# Patient Record
Sex: Female | Born: 1970 | Race: Black or African American | Hispanic: No | Marital: Single | Smoking: Current every day smoker
Health system: Southern US, Community
[De-identification: ages and names within clinical notes are randomized; demographics above are authoritative.]

## PROBLEM LIST (undated history)

## (undated) DIAGNOSIS — M109 Gout, unspecified: Secondary | ICD-10-CM

---

## 1991-01-30 HISTORY — PX: TUBAL LIGATION: SHX77

## 2015-10-12 ENCOUNTER — Emergency Department
Admission: EM | Admit: 2015-10-12 | Discharge: 2015-10-12 | Disposition: A | Discharge status: Home / Self Care | Payer: Self-pay | Attending: Student in an Organized Health Care Education/Training Program | Admitting: Student in an Organized Health Care Education/Training Program

## 2015-10-12 DIAGNOSIS — M10071 Idiopathic gout, right ankle and foot: Secondary | ICD-10-CM | POA: Insufficient documentation

## 2015-10-12 DIAGNOSIS — F17200 Nicotine dependence, unspecified, uncomplicated: Secondary | ICD-10-CM | POA: Insufficient documentation

## 2015-10-12 DIAGNOSIS — M109 Gout, unspecified: Secondary | ICD-10-CM

## 2015-10-12 MED ORDER — INDOMETHACIN 50 MG PO CAPS
50.0000 mg | ORAL_CAPSULE | Freq: Once | ORAL | Status: AC
  Administered 2015-10-12: 50 mg via ORAL
  Filled 2015-10-12: qty 1

## 2015-10-12 MED ORDER — INDOMETHACIN 50 MG PO CAPS: 50.0000 mg | ORAL_CAPSULE | Freq: Two times a day (BID) | ORAL | 0 refills | Status: DC

## 2015-10-12 NOTE — ED Notes (Signed)
Pt has right 2nd,3rd, 4th,5th toe pain.  Pt states sx for 3 days.  Toes are red and tender to touch.  No known injury to foot.  No hx gout. Pt states she is on her feet a lot at work.

## 2015-10-12 NOTE — ED Provider Notes (Signed)
[facility] Emergency Department Provider Note   ____________________________________________   First MD Initiated Contact with Patient 10/12/15 1603     (approximate)  I have reviewed the triage vital signs and the nursing notes.   HISTORY  Chief Complaint Foot Pain   HPI Maria Knox is a 45 y.o. female who presents with right foot pain x3 days. Patient began having pain Monday after work, pain has moved across toes but has not worsened in severity. She describes the pain as a throbbing sensation that is intermittent and exacerbated by putting weight on the RLE. She has also noticed some redness and swelling, and burning sensation in toes. No history of diabetes, blood clots, or gout. She denies any recent trauma to the right foot. She has been using bengay-like ointment and ibuprofen. The ibuprofen gives her moderate pain relief.    No past medical history on file.  There are no active problems to display for this patient.   Past Surgical History:  Procedure Laterality Date  . TUBAL LIGATION  1993    Prior to Admission medications   Medication Sig Start Date End Date Taking? Authorizing Provider  indomethacin (INDOCIN) 50 MG capsule Take 1 capsule (50 mg total) by mouth 2 (two) times daily with a meal. 10/12/15   Chinita Pesterari B Bralyn Espino, FNP    Allergies Codeine  No family history on file.  Social History Social History  Substance Use Topics  . Smoking status: Current Every Day Smoker  . Smokeless tobacco: Not on file  . Alcohol use Yes    Review of Systems Constitutional: No fever/chills Cardiovascular: Denies chest pain. Respiratory: Denies shortness of breath. Gastrointestinal: No abdominal pain.  No nausea, no vomiting.  No diarrhea.  No constipation. Genitourinary: Negative for dysuria. Musculoskeletal: Negative for back pain. Positive for right foot pain across her toes.  Skin: Negative for rash. Neurological: Negative for  headaches, focal weakness or numbness. ____________________________________________   PHYSICAL EXAM:  VITAL SIGNS: ED Triage Vitals  Enc Vitals Group     BP 10/12/15 1545 116/64     Pulse Rate 10/12/15 1543 84     Resp 10/12/15 1543 18     Temp 10/12/15 1543 98.3 F (36.8 C)     Temp Source 10/12/15 1543 Oral     SpO2 10/12/15 1543 100 %     Weight 10/12/15 1544 130 lb (59 kg)     Height 10/12/15 1544 5\' 2"  (1.575 m)     Head Circumference --      Peak Flow --      Pain Score 10/12/15 1544 3     Pain Loc --      Pain Edu? --      Excl. in GC? --     Constitutional: Alert and oriented. Well appearing and in no acute distress. Eyes: Conjunctivae are normal.  Head: Atraumatic. Neck: No stridor.   Cardiovascular: DP and PT pulses 2+ bilaterally. Toes acyanotic and warm with brisk capillary refill bilaterally.  Respiratory: Normal respiratory effort.  No retractions.  Musculoskeletal: Right foot with mild swelling and erythema across metatarsal heads, no warmth felt. Full ROM of right ankle without pain or difficulty.  Neurologic:  Normal speech and language. No gross focal neurologic deficits are appreciated. No gait instability. Skin:  Skin is warm, dry and intact. No rash noted. Psychiatric: Mood and affect are normal. Speech and behavior are normal.  ____________________________________________   LABS (all labs ordered are listed, but only abnormal  results are displayed)  Labs Reviewed - No data to display ____________________________________________  EKG  ____________________________________________  RADIOLOGY  ____________________________________________   PROCEDURES  Procedure(s) performed: None  Procedures  Critical Care performed: No  ____________________________________________   INITIAL IMPRESSION / ASSESSMENT AND PLAN / ED COURSE  Pertinent labs & imaging results that were available during my care of the patient were reviewed by me and  considered in my medical decision making (see chart for details).  Patient given 50 mg indomethacin in emergency department for treatment of right foot pain from gout. Given prescription for indomethacin. Referred for follow up with podiatry. No other emergency medicine complaints at this time.   Clinical Course     ____________________________________________   FINAL CLINICAL IMPRESSION(S) / ED DIAGNOSES  Final diagnoses:  Acute gout of right foot, unspecified cause      NEW MEDICATIONS STARTED DURING THIS VISIT:  There are no discharge medications for this patient.    Note:  This document was prepared using Dragon voice recognition software and may include unintentional dictation errors.   Chinita Pester, FNP 10/12/15 2003    Willy Eddy, MD 10/12/15 [phone]

## 2015-10-12 NOTE — ED Triage Notes (Signed)
Pt arrives with c/o right sided foot and toe pain  Pt reports on Monday after she got off work her toes were hurting and she has tried home remedies with no relief

## 2015-11-02 MED ORDER — ONDANSETRON HCL 4 MG/2ML IJ SOLN
INTRAMUSCULAR | Status: AC
  Filled 2015-11-02: qty 2

## 2015-11-02 MED ORDER — HYDROMORPHONE HCL 1 MG/ML IJ SOLN
INTRAMUSCULAR | Status: AC
  Filled 2015-11-02: qty 1

## 2016-08-11 ENCOUNTER — Emergency Department: Payer: Self-pay

## 2016-08-11 ENCOUNTER — Encounter: Payer: Self-pay | Admitting: Emergency Medicine

## 2016-08-11 ENCOUNTER — Emergency Department
Admission: EM | Admit: 2016-08-11 | Discharge: 2016-08-12 | Disposition: A | Discharge status: Home / Self Care | Payer: Self-pay | Attending: Emergency Medicine | Admitting: Emergency Medicine

## 2016-08-11 DIAGNOSIS — R079 Chest pain, unspecified: Secondary | ICD-10-CM | POA: Insufficient documentation

## 2016-08-11 DIAGNOSIS — F17210 Nicotine dependence, cigarettes, uncomplicated: Secondary | ICD-10-CM | POA: Insufficient documentation

## 2016-08-11 DIAGNOSIS — R918 Other nonspecific abnormal finding of lung field: Secondary | ICD-10-CM | POA: Insufficient documentation

## 2016-08-11 DIAGNOSIS — F439 Reaction to severe stress, unspecified: Secondary | ICD-10-CM | POA: Insufficient documentation

## 2016-08-11 HISTORY — DX: Gout, unspecified: M10.9

## 2016-08-11 LAB — BASIC METABOLIC PANEL
Anion gap: 11 (ref 5–15)
BUN: 6 mg/dL (ref 6–20)
CALCIUM: 9.2 mg/dL (ref 8.9–10.3)
CHLORIDE: 106 mmol/L (ref 101–111)
CO2: 19 mmol/L — AB (ref 22–32)
CREATININE: 0.82 mg/dL (ref 0.44–1.00)
GFR calc Af Amer: >60 mL/min (ref >60)
GFR calc non Af Amer: >60 mL/min (ref >60)
Glucose, Bld: 100 mg/dL — ABNORMAL HIGH (ref 65–99)
Potassium: 3.8 mmol/L (ref 3.5–5.1)
SODIUM: 136 mmol/L (ref 135–145)

## 2016-08-11 LAB — TROPONIN I
Troponin I: <0.03 ng/mL (ref <0.03)
Troponin I: <0.03 ng/mL (ref <0.03)

## 2016-08-11 LAB — CBC
HCT: 45.9 % (ref 35.0–47.0)
Hemoglobin: 15.6 g/dL (ref 12.0–16.0)
MCH: 32.4 pg (ref 26.0–34.0)
MCHC: 34 g/dL (ref 32.0–36.0)
MCV: 95.5 fL (ref 80.0–100.0)
PLATELETS: 235 10*3/uL (ref 150–440)
RBC: 4.81 MIL/uL (ref 3.80–5.20)
RDW: 12.4 % (ref 11.5–14.5)
WBC: 11.6 10*3/uL — AB (ref 3.6–11.0)

## 2016-08-11 MED ORDER — KETOROLAC TROMETHAMINE 30 MG/ML IJ SOLN
10.0000 mg | Freq: Once | INTRAMUSCULAR | Status: AC
  Administered 2016-08-11: 9.9 mg via INTRAVENOUS
  Filled 2016-08-11: qty 1

## 2016-08-11 MED ORDER — IOPAMIDOL (ISOVUE-370) INJECTION 76%
75.0000 mL | Freq: Once | INTRAVENOUS | Status: AC | PRN
  Administered 2016-08-11: 75 mL via INTRAVENOUS

## 2016-08-11 MED ORDER — LORAZEPAM 2 MG/ML IJ SOLN
0.5000 mg | Freq: Once | INTRAMUSCULAR | Status: AC
  Administered 2016-08-11: 0.5 mg via INTRAVENOUS
  Filled 2016-08-11: qty 1

## 2016-08-11 MED ORDER — SODIUM CHLORIDE 0.9 % IV BOLUS (SEPSIS)
1000.0000 mL | Freq: Once | INTRAVENOUS | Status: AC
  Administered 2016-08-11: 1000 mL via INTRAVENOUS

## 2016-08-11 NOTE — ED Provider Notes (Signed)
[facility] Emergency Department Provider Note   ____________________________________________   First MD Initiated Contact with Patient 08/11/16 2301     (approximate)  I have reviewed the triage vital signs and the nursing notes.   HISTORY  Chief Complaint Chest Pain    HPI Maria Knox is a 46 y.o. female who presents to the ED from home with a chief complaint of chest pain. Patient reports onset of right chest pain last night. Went away and started again while she was at work at [employer]. Describes right sided chest pressure radiating to her neck and right arm. Denies associated diaphoresis, shortness of breath, nausea/vomiting, palpitations or dizziness. Does state she is under more stress recently due to her sons. Denies recent fever, chills, cough, congestion, abdominal pain, dysuria, diarrhea. Denies recent travel or trauma.Resting makes the pain better. Stress and deep breathing makes the pain worse.   Past Medical History:  Diagnosis Date  . Gout     There are no active problems to display for this patient.   Past Surgical History:  Procedure Laterality Date  . TUBAL LIGATION  1993    Prior to Admission medications   Medication Sig Start Date End Date Taking? Authorizing Provider  LORazepam (ATIVAN) 1 MG tablet Take 1 tablet (1 mg total) by mouth every 8 (eight) hours as needed for anxiety. 08/12/16   Irean Hong, MD    Allergies Codeine  Family history None  Social History Social History  Substance Use Topics  . Smoking status: Current Every Day Smoker    Packs/day: 1.00    Types: Cigarettes  . Smokeless tobacco: Never Used  . Alcohol use Yes    Review of Systems  Constitutional: No fever/chills. Eyes: No visual changes. ENT: No sore throat. Cardiovascular: Positive for chest pain. Respiratory: Denies shortness of breath. Gastrointestinal: No abdominal pain.  No nausea, no vomiting.  No diarrhea.  No  constipation. Genitourinary: Negative for dysuria. Musculoskeletal: Negative for back pain. Skin: Negative for rash. Neurological: Negative for headaches, focal weakness or numbness. Psychiatric:Positive for stress.  ____________________________________________   PHYSICAL EXAM:  VITAL SIGNS: ED Triage Vitals  Enc Vitals Group     BP 08/11/16 1657 128/82     Pulse Rate 08/11/16 1657 86     Resp 08/11/16 1657 18     Temp 08/11/16 1657 98.1 F (36.7 C)     Temp Source 08/11/16 1657 Oral     SpO2 08/11/16 1657 98 %     Weight 08/11/16 1658 130 lb (59 kg)     Height 08/11/16 1658 5\' 2"  (1.575 m)     Head Circumference --      Peak Flow --      Pain Score 08/11/16 1659 5     Pain Loc --      Pain Edu? --      Excl. in GC? --     Constitutional: Alert and oriented. Well appearing and in no acute distress. Eyes: Conjunctivae are normal. PERRL. EOMI. Head: Atraumatic. Nose: No congestion/rhinnorhea. Mouth/Throat: Mucous membranes are moist.  Oropharynx non-erythematous. Neck: No stridor.   Cardiovascular: Normal rate, regular rhythm. Grossly normal heart sounds.  Good peripheral circulation. Respiratory: Normal respiratory effort.  No retractions. Lungs CTAB. Gastrointestinal: Soft and nontender. No distention. No abdominal bruits. No CVA tenderness. Musculoskeletal: No lower extremity tenderness nor edema.  No joint effusions. Neurologic:  Normal speech and language. No gross focal neurologic deficits are appreciated. No gait instability. Skin:  Skin is warm, dry and intact. No rash noted. Psychiatric: Mood and affect are normal. Speech and behavior are normal.  ____________________________________________   LABS (all labs ordered are listed, but only abnormal results are displayed)  Labs Reviewed  BASIC METABOLIC PANEL - Abnormal; Notable for the following:       Result Value   CO2 19 (*)    Glucose, Bld 100 (*)    All other components within normal limits  CBC -  Abnormal; Notable for the following:    WBC 11.6 (*)    All other components within normal limits  TROPONIN I  TROPONIN I   ____________________________________________  EKG  ED ECG REPORT I, Epiphany Seltzer J, the attending physician, personally viewed and interpreted this ECG.   Date: 08/11/2016  EKG Time: 1656  Rate: 93  Rhythm: normal EKG, normal sinus rhythm  Axis: Normal  Intervals:none  ST&T Change: Nonspecific  ____________________________________________  RADIOLOGY  Dg Chest 2 View  Result Date: 08/11/2016 CLINICAL DATA:  RIGHT anterior upper chest and shoulder pain since last night, smoker EXAM: CHEST  2 VIEW COMPARISON:  None FINDINGS: Normal heart size, mediastinal contours, and pulmonary vascularity. Lungs clear. No pleural effusion or pneumothorax. Bones unremarkable. IMPRESSION: Normal exam. Electronically Signed   By: Ulyses Southward M.D.   On: 08/11/2016 17:30   Ct Angio Chest Pe W/cm &/or Wo Cm  Result Date: 08/12/2016 CLINICAL DATA:  Right-sided chest pain EXAM: CT ANGIOGRAPHY CHEST WITH CONTRAST TECHNIQUE: Multidetector CT imaging of the chest was performed using the standard protocol during bolus administration of intravenous contrast. Multiplanar CT image reconstructions and MIPs were obtained to evaluate the vascular anatomy. CONTRAST:  75 mL Isovue 370 intravenous COMPARISON:  Radiograph 08/11/2016 FINDINGS: Cardiovascular: Satisfactory opacification of the pulmonary arteries to the segmental level. No evidence of pulmonary embolism. Normal heart size. No pericardial effusion. Non aneurysmal aorta. No dissection is seen. Mediastinum/Nodes: No enlarged mediastinal, hilar, or axillary lymph nodes. Thyroid gland, trachea, and esophagus demonstrate no significant findings. Lungs/Pleura: Mild upper lobe emphysema. Two adjacent pulmonary nodules in the subpleural right lower lobe, series 6, image number 58 and 59. The larger nodule measures 4 mm. No pleural effusion or acute  pulmonary infiltrate. Upper Abdomen: No acute abnormality. Musculoskeletal: No chest wall abnormality. No acute or significant osseous findings. Review of the MIP images confirms the above findings. IMPRESSION: 1. Negative for acute pulmonary embolus or aortic dissection. 2. Mild emphysema. 3. Two pulmonary nodules in the right lower lobe. No follow-up needed if patient is low-risk. Non-contrast chest CT can be considered in 12 months if patient is high-risk. This recommendation follows the consensus statement: Guidelines for Management of Incidental Pulmonary Nodules Detected on CT Images: From the Fleischner Society 2017; Radiology 2017; 284:228-243. Emphysema (ICD10-J43.9). Electronically Signed   By: Jasmine Pang M.D.   On: 08/12/2016 00:24    ____________________________________________   PROCEDURES  Procedure(s) performed: None  Procedures  Critical Care performed: No  ____________________________________________   INITIAL IMPRESSION / ASSESSMENT AND PLAN / ED COURSE  Pertinent labs & imaging results that were available during my care of the patient were reviewed by me and considered in my medical decision making (see chart for details).  46 year old female with no significant medical history who presents with intermittent right-sided chest pain. Most likely secondary to stress; however, pain is worsened with deep breathing. Patient has 2 sets of negative troponins; unlikely ACS or dissection. Will obtain CT chest to evaluate for pulmonary embolism.  Clinical Course as of  Aug 12 28  Wynelle LinkSun Aug 12, 2016  0030 Updated patient on CT imaging results. Specifically, we discussed she will need to have a follow-up CT scan in 1 year to monitor the 2 nodules in her right lung. Strict return precautions given. Patient and family member verbalized understanding and agree with plan of care.  [JS]    Clinical Course User Index [JS] Irean HongSung, Nichols Corter J, MD      ____________________________________________   FINAL CLINICAL IMPRESSION(S) / ED DIAGNOSES  Final diagnoses:  Nonspecific chest pain  Stress  Pulmonary nodules      NEW MEDICATIONS STARTED DURING THIS VISIT:  New Prescriptions   LORAZEPAM (ATIVAN) 1 MG TABLET    Take 1 tablet (1 mg total) by mouth every 8 (eight) hours as needed for anxiety.     Note:  This document was prepared using Dragon voice recognition software and may include unintentional dictation errors.    Irean HongSung, Andri Prestia J, MD 08/12/16 [phone]

## 2016-08-11 NOTE — ED Triage Notes (Signed)
Pt reports right chest pain radiating to neck and right arm that started last night.  Pt states she took 2 ASA last night.  No medications for pain.  Pt states the pain came back today while she was at work. Pt reports the pain feels like pressure and 4-5/10 pain scale.

## 2016-08-11 NOTE — ED Notes (Signed)
Patient transported to CT 

## 2016-08-12 MED ORDER — LORAZEPAM 1 MG PO TABS: 1.0000 mg | ORAL_TABLET | Freq: Three times a day (TID) | ORAL | 0 refills | Status: AC | PRN

## 2016-08-12 NOTE — Discharge Instructions (Signed)
1. You may take Ativan as needed for stress/anxiety. 2. You will need a follow-up noncontrast chest CT in 12 months to monitor the 2 nodules in your right lung. 3. Return to the ER for worsening symptoms, persistent vomiting, difficult breathing or other concerns.

## 2017-03-24 ENCOUNTER — Other Ambulatory Visit: Payer: Self-pay

## 2017-03-24 ENCOUNTER — Emergency Department
Admission: EM | Admit: 2017-03-24 | Discharge: 2017-03-24 | Disposition: A | Discharge status: Home / Self Care | Payer: Self-pay | Attending: Emergency Medicine | Admitting: Emergency Medicine

## 2017-03-24 ENCOUNTER — Encounter: Payer: Self-pay | Admitting: Emergency Medicine

## 2017-03-24 DIAGNOSIS — F410 Panic disorder [episodic paroxysmal anxiety] without agoraphobia: Secondary | ICD-10-CM | POA: Insufficient documentation

## 2017-03-24 DIAGNOSIS — F17210 Nicotine dependence, cigarettes, uncomplicated: Secondary | ICD-10-CM | POA: Insufficient documentation

## 2017-03-24 MED ORDER — LORAZEPAM 1 MG PO TABS
1.0000 mg | ORAL_TABLET | Freq: Once | ORAL | Status: AC
  Administered 2017-03-24: 1 mg via ORAL
  Filled 2017-03-24: qty 1

## 2017-03-24 MED ORDER — LORAZEPAM 1 MG PO TABS: 1.0000 mg | ORAL_TABLET | Freq: Three times a day (TID) | ORAL | 0 refills | Status: AC | PRN

## 2017-03-24 NOTE — ED Triage Notes (Addendum)
Here because feeling like have another panic attack. Has had 2 friends recently pass away.  Ran out of ativan given last time was here.  Her work made her angry today and she lost it and here tearful. Denies SI/HI.  Feels like can't catch breath but it comes in waves.  Came EMS.  Rapid speech.    "I just hold so much in and then it all comes out".

## 2017-03-24 NOTE — ED Provider Notes (Signed)
[facility] Emergency Department Provider Note  ____________________________________________   First MD Initiated Contact with Patient 03/24/17 1716     (approximate)  I have reviewed the triage vital signs and the nursing notes.   HISTORY  Chief Complaint Anxiety   HPI Maria Knox is a 47 y.o. female who comes to the emergency department via EMS with what she believes is a panic attack.  The patient was at work when she began to hyperventilate feel sharp severe upper chest pain and felt like she could not catch her breath.  She has been under tremendous amount of stress recently as she has had multiple deaths within her family and her social circle.  Her symptoms are worse when she thinks about these events and somewhat improved when she calms down and rest.  She is currently tearful and uncomfortable appearing and is requesting "help".  Past Medical History:  Diagnosis Date  . Gout     There are no active problems to display for this patient.   Past Surgical History:  Procedure Laterality Date  . TUBAL LIGATION  1993    Prior to Admission medications   Medication Sig Start Date End Date Taking? Authorizing Provider  LORazepam (ATIVAN) 1 MG tablet Take 1 tablet (1 mg total) by mouth every 8 (eight) hours as needed for anxiety. 08/12/16   Irean HongSung, Jade J, MD  LORazepam (ATIVAN) 1 MG tablet Take 1 tablet (1 mg total) by mouth every 8 (eight) hours as needed for up to 5 doses for anxiety. 03/24/17 03/28/17  Merrily Brittleifenbark, Dejia Ebron, MD    Allergies Codeine  History reviewed. No pertinent family history.  Social History Social History   Tobacco Use  . Smoking status: Current Every Day Smoker    Packs/day: 1.00    Types: Cigarettes  . Smokeless tobacco: Never Used  Substance Use Topics  . Alcohol use: Yes  . Drug use: Not on file    Review of Systems Constitutional: No fever/chills Eyes: No visual changes. ENT: No sore throat. Cardiovascular:  Positive for chest pain. Respiratory: Positive for shortness of breath. Gastrointestinal: No abdominal pain.  Positive for nausea, no vomiting.  No diarrhea.  No constipation. Genitourinary: Negative for dysuria. Musculoskeletal: Negative for back pain. Skin: Negative for rash. Neurological: Negative for headaches, focal weakness or numbness.   ____________________________________________   PHYSICAL EXAM:  VITAL SIGNS: ED Triage Vitals  Enc Vitals Group     BP 03/24/17 1546 125/88     Pulse Rate 03/24/17 1546 86     Resp 03/24/17 1546 (!) 22     Temp 03/24/17 1546 98.5 F (36.9 C)     Temp Source 03/24/17 1546 Oral     SpO2 03/24/17 1546 98 %     Weight 03/24/17 1537 130 lb (59 kg)     Height 03/24/17 1537 5\' 2"  (1.575 m)     Head Circumference --      Peak Flow --      Pain Score --      Pain Loc --      Pain Edu? --      Excl. in GC? --     Constitutional: Anxious appearing tearful speaks in full clear sentences Eyes: PERRL EOMI. Head: Atraumatic. Nose: No congestion/rhinnorhea. Mouth/Throat: No trismus Neck: No stridor.   Cardiovascular: Tachycardic rate, regular rhythm. Grossly normal heart sounds.  Good peripheral circulation. Respiratory: Increased respiratory effort.  No retractions. Lungs CTAB and moving good air Gastrointestinal: Soft nontender Musculoskeletal:  No lower extremity edema   Neurologic:  Normal speech and language. No gross focal neurologic deficits are appreciated. Skin:  Skin is warm, dry and intact. No rash noted. Psychiatric: Anxious and tearful   ____________________________________________   DIFFERENTIAL includes but not limited to  Panic attack, generalized anxiety disorder, major depressive disorder, acute coronary syndrome, pericarditis, myocarditis ____________________________________________   LABS (all labs ordered are listed, but only abnormal results are displayed)  Labs Reviewed - No data to  display   __________________________________________  EKG  ED ECG REPORT I, Merrily Brittle, the attending physician, personally viewed and interpreted this ECG.  Date: 03/25/2017 EKG Time:  Rate: 93 Rhythm: normal sinus rhythm QRS Axis: normal Intervals: normal ST/T Wave abnormalities: normal Narrative Interpretation: no evidence of acute ischemia  ____________________________________________  RADIOLOGY   ____________________________________________   PROCEDURES  Procedure(s) performed: no  Procedures  Critical Care performed: no  Observation: no ____________________________________________   INITIAL IMPRESSION / ASSESSMENT AND PLAN / ED COURSE  Pertinent labs & imaging results that were available during my care of the patient were reviewed by me and considered in my medical decision making (see chart for details).  The patient had shortness of breath palpitations chest pain shortness of breath along with an overwhelming sense of doom when thinking about difficulty in her life.  This is consistent with a panic attack.  She feels improved after some Ativan.  I do lengthy discussion with the patient regarding her symptoms and have encouraged her to establish care with a therapist and psychiatrist as an outpatient.  No indication for involuntary commitment at this time.  The patient is not driving.  She is discharged home in improved condition with 5 tablets of lorazepam as an outpatient.  She verbalizes understanding and agreement with the plan.      ____________________________________________   FINAL CLINICAL IMPRESSION(S) / ED DIAGNOSES  Final diagnoses:  Panic attack      NEW MEDICATIONS STARTED DURING THIS VISIT:  Discharge Medication List as of 03/24/2017  5:29 PM    START taking these medications   Details  !! LORazepam (ATIVAN) 1 MG tablet Take 1 tablet (1 mg total) by mouth every 8 (eight) hours as needed for up to 5 doses for anxiety., Starting  Sun 03/24/2017, Until Thu 03/28/2017, Print     !! - Potential duplicate medications found. Please discuss with provider.       Note:  This document was prepared using Dragon voice recognition software and may include unintentional dictation errors.     Merrily Brittle, MD 03/25/17 2208

## 2017-03-24 NOTE — ED Notes (Signed)
Discussed with dr quale 

## 2017-03-24 NOTE — Discharge Instructions (Signed)
Please take your Ativan as needed for severe anxiety and follow-up with therapist within the next 2 days for reevaluation.  Return to the emergency department sooner for any concerns whatsoever.  It was a pleasure to take care of you today, and thank you for coming to our emergency department.  If you have any questions or concerns before leaving please ask the nurse to grab me and I'm more than happy to go through your aftercare instructions again.  If you were prescribed any opioid pain medication today such as Norco, Vicodin, Percocet, morphine, hydrocodone, or oxycodone please make sure you do not drive when you are taking this medication as it can alter your ability to drive safely.  If you have any concerns once you are home that you are not improving or are in fact getting worse before you can make it to your follow-up appointment, please do not hesitate to call 911 and come back for further evaluation.  Merrily BrittleNeil Saanvi Hakala, MD  While here in the ER today you received very powerful medicine that makes it unsafe for you to drive for the rest of the day.  Do not drive until tomorrow.

## 2017-03-24 NOTE — ED Notes (Signed)
FN: pt brought in ems with reports of anxiety attack that has been resolved. vss per ems. Recent death in the family.

## 2017-03-24 NOTE — ED Notes (Signed)

## 2017-03-24 NOTE — ED Notes (Signed)
ED Provider at bedside. 

## 2018-11-10 IMAGING — CT CT ANGIO CHEST
2 of 6 series · 19 of 46 positions shown · IV contrast (APPLIED)
Comparison: Radiograph 08/11/2016

CLINICAL DATA: Right-sided chest pain

EXAM:
CT ANGIOGRAPHY CHEST WITH CONTRAST
TECHNIQUE: Multidetector CT imaging of the chest was performed using the
standard protocol during bolus administration of intravenous
contrast. Multiplanar CT image reconstructions and MIPs were
obtained to evaluate the vascular anatomy.
CONTRAST:  75 mL Isovue 370 intravenous

[Series 5: thins · axial · 0.58mm/px · z∈[-346,-84]mm · 17 of 288 slices shown]
[im 13/288  lung]
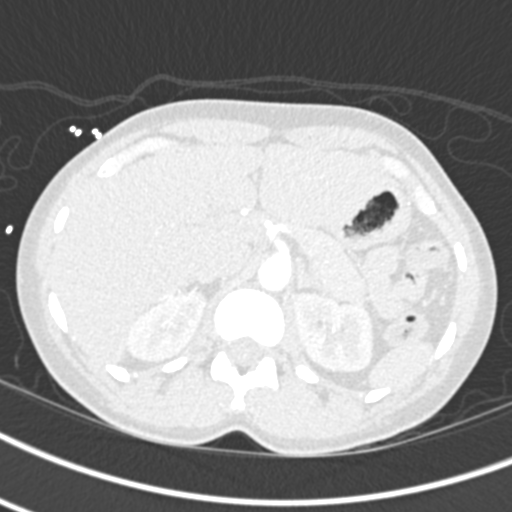
[im 25/288  soft-tissue]
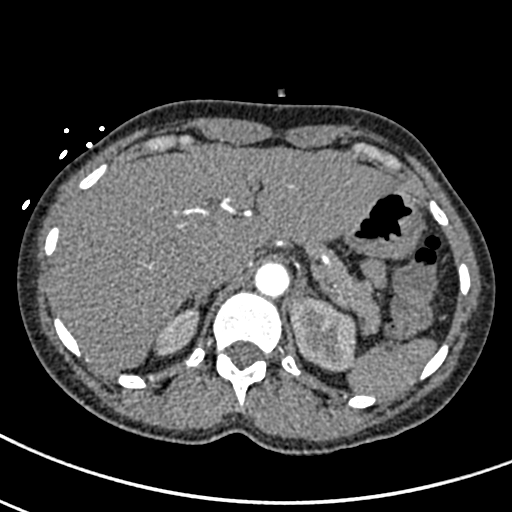
[im 50/288  lung]
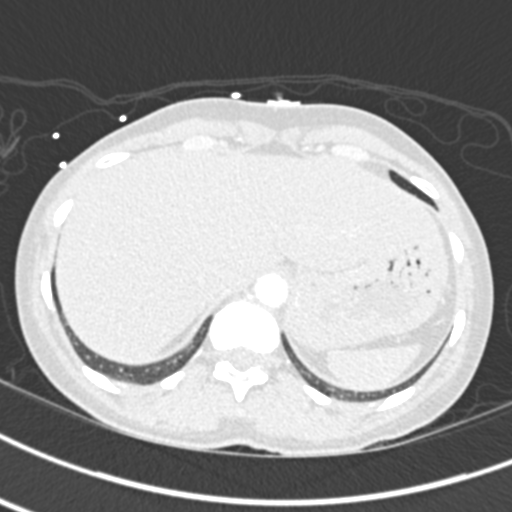
[im 63/288  soft-tissue]
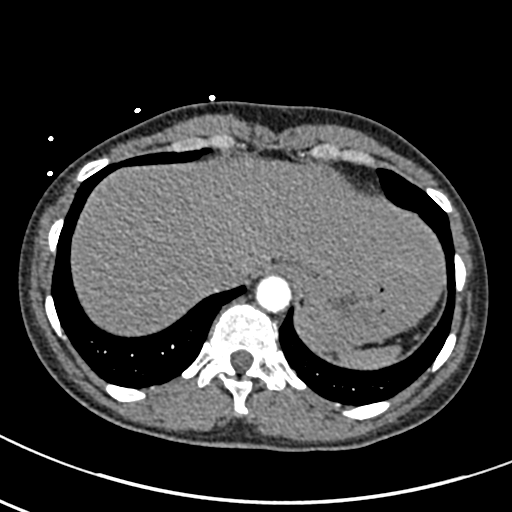
[im 75/288  lung]
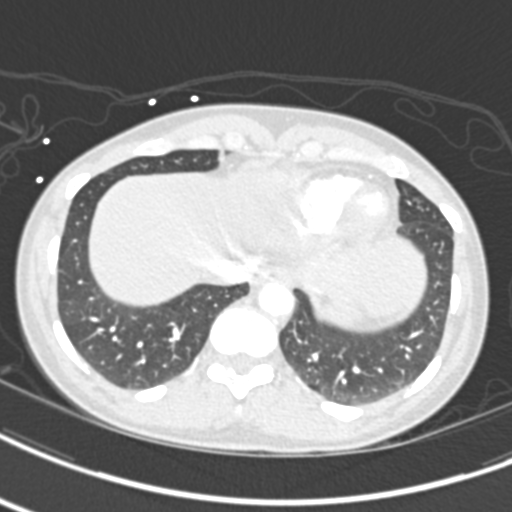
[im 100/288  soft-tissue]
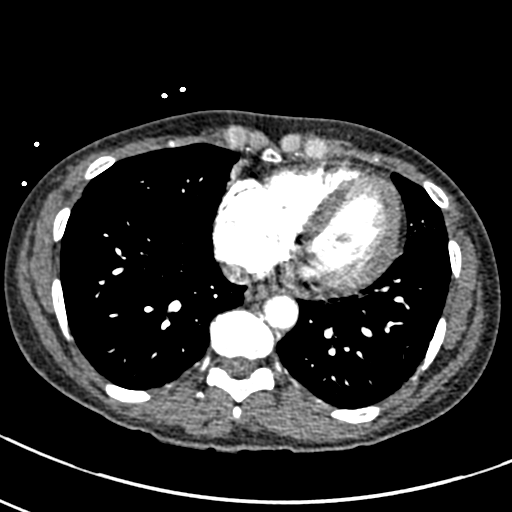
[im 113/288  lung]
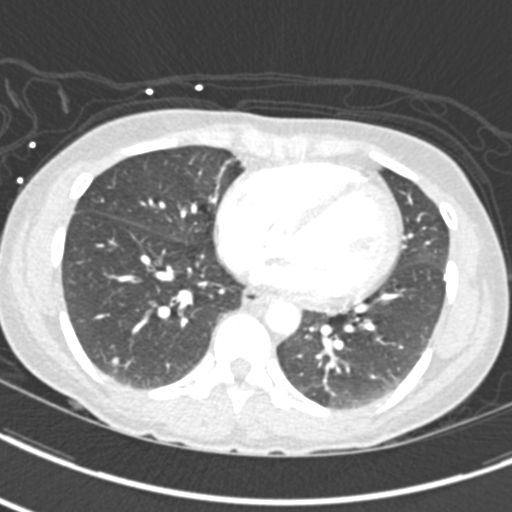
[im 125/288  soft-tissue]
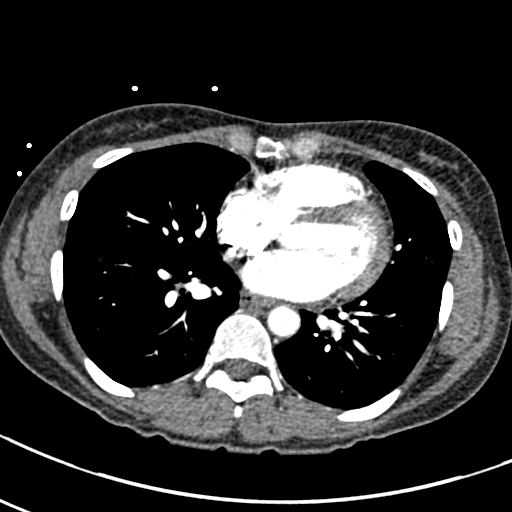
[im 150/288  lung]
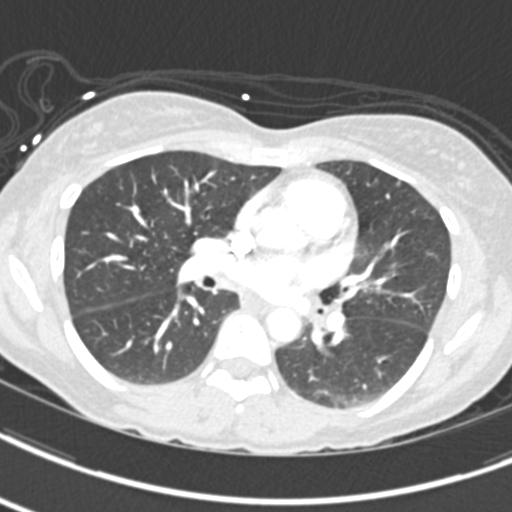
[im 163/288  soft-tissue]
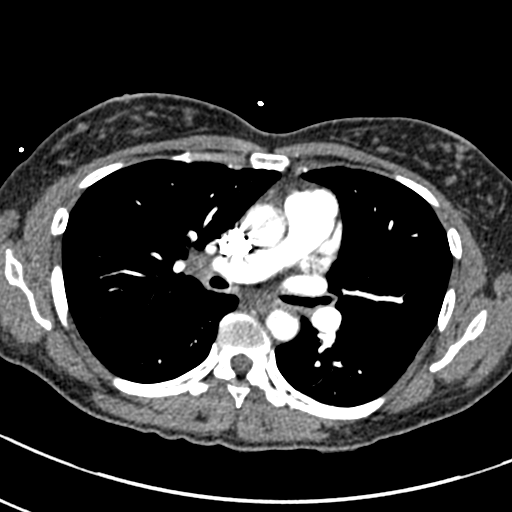
[im 175/288  lung]
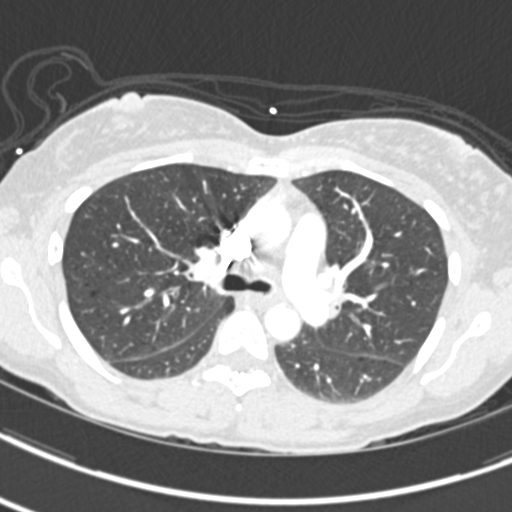
[im 188/288  soft-tissue]
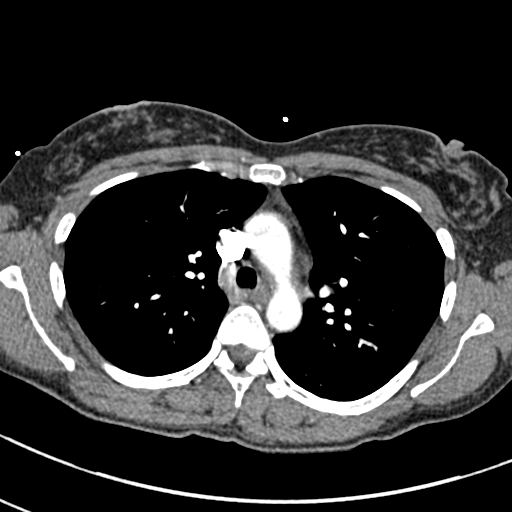
[im 213/288  lung]
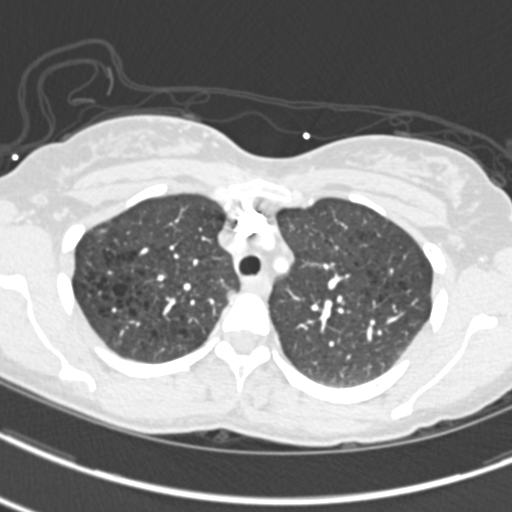
[im 225/288  soft-tissue]
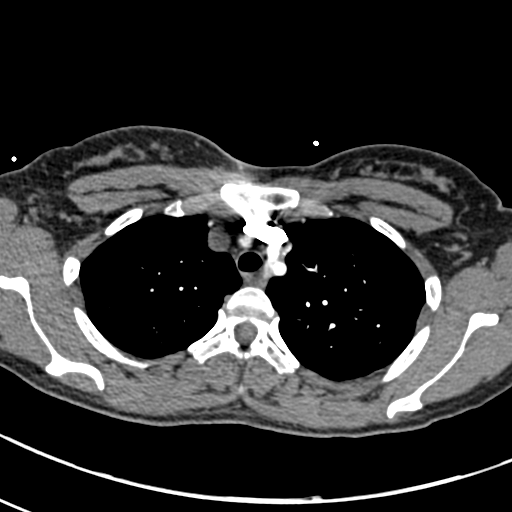
[im 238/288  lung]
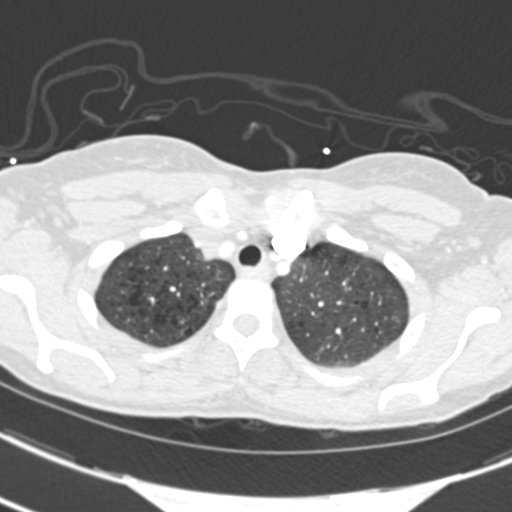
[im 263/288  soft-tissue]
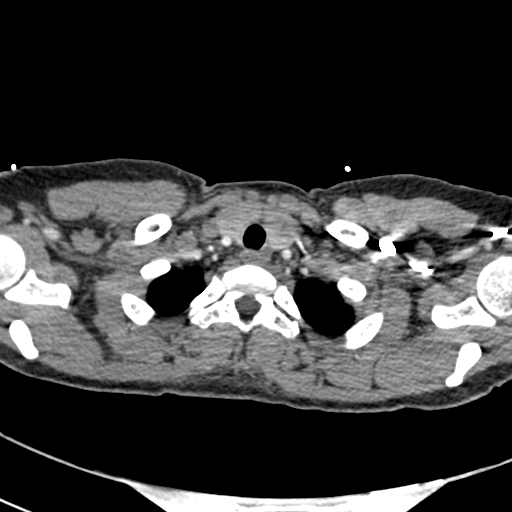
[im 275/288  lung]
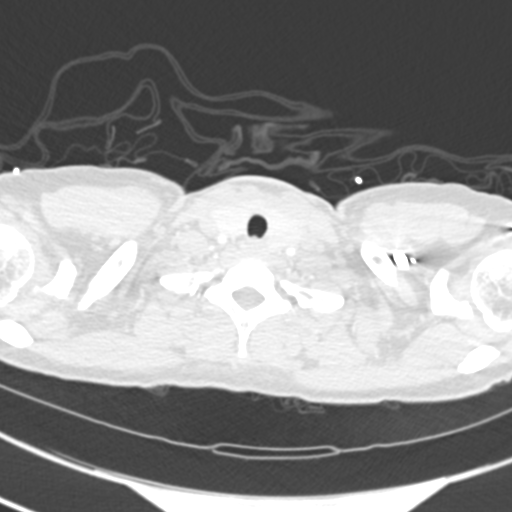

[Series 7: coronal mpr · coronal · 0.56mm/px · 2 of 69 slices shown]
[im 23/69  soft-tissue]
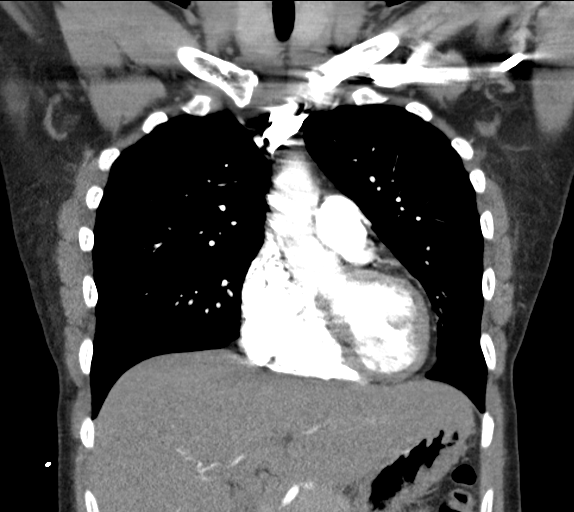
[im 46/69  soft-tissue]
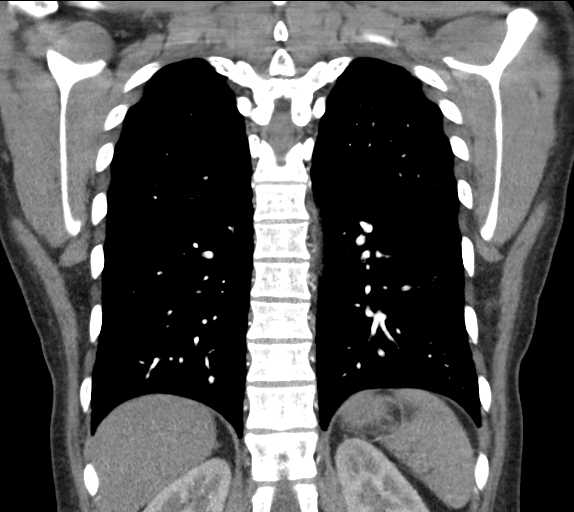

[19 of 46 positions shown; findings below may reference images not displayed]

FINDINGS: Cardiovascular: Satisfactory opacification of the pulmonary arteries
to the segmental level. No evidence of pulmonary embolism. Normal
heart size. No pericardial effusion. Non aneurysmal aorta. No
dissection is seen.

Mediastinum/Nodes: No enlarged mediastinal, hilar, or axillary lymph
nodes. Thyroid gland, trachea, and esophagus demonstrate no
significant findings.

Lungs/Pleura: Mild upper lobe emphysema. Two adjacent pulmonary
nodules in the subpleural right lower lobe, series 6, image number
58 and 59. The larger nodule measures 4 mm. No pleural effusion or
acute pulmonary infiltrate.

Upper Abdomen: No acute abnormality.

Musculoskeletal: No chest wall abnormality. No acute or significant
osseous findings.

Review of the MIP images confirms the above findings.
IMPRESSION: 1. Negative for acute pulmonary embolus or aortic dissection.
2. Mild emphysema.
3. Two pulmonary nodules in the right lower lobe. No follow-up
needed if patient is low-risk. Non-contrast chest CT can be
considered in 12 months if patient is high-risk. This recommendation
follows the consensus statement: Guidelines for Management of
Incidental Pulmonary Nodules Detected on CT Images: From the

Emphysema ([id number].1).

## 2018-11-10 IMAGING — CR DG CHEST 2V
1 series · 2 of 2 positions shown · non-contrast
Comparison: None

CLINICAL DATA: RIGHT anterior upper chest and shoulder pain since
last night, smoker

EXAM:
CHEST  2 VIEW

[Series 1: w chest pa · 0.14mm/px · 2 of 2 slices shown]
[im 1/2]
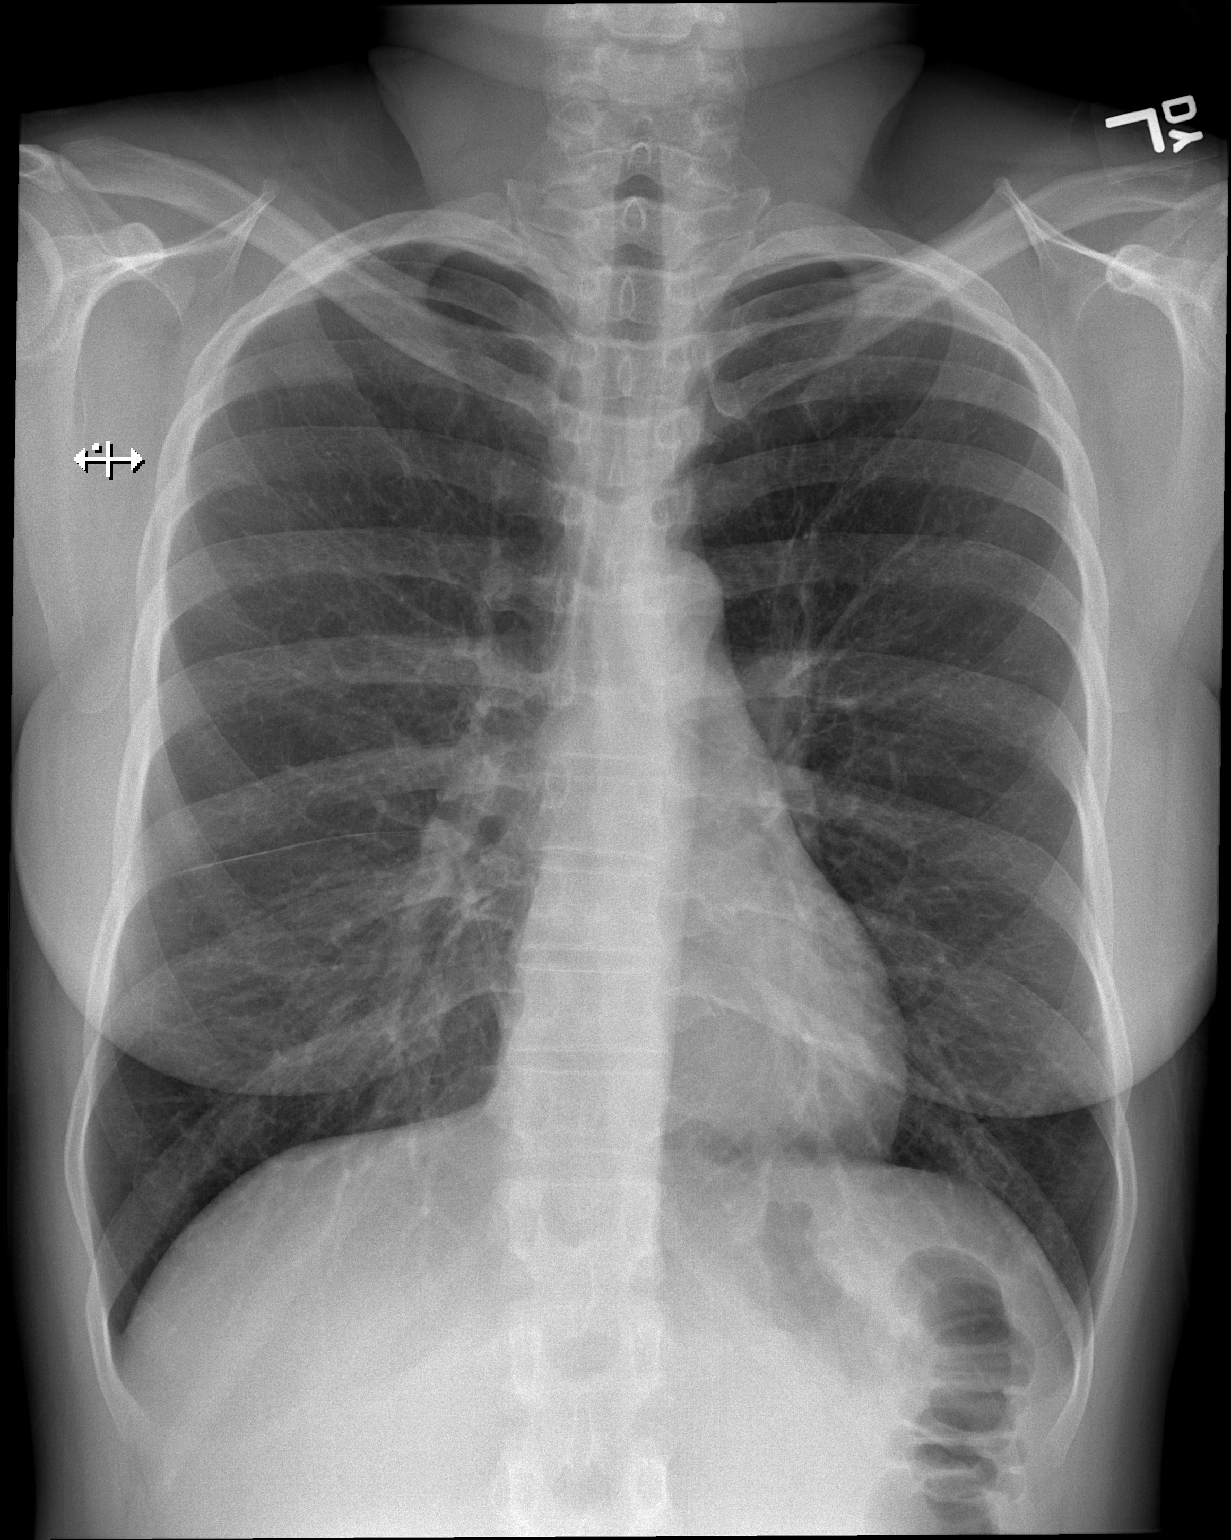
[im 2/2]
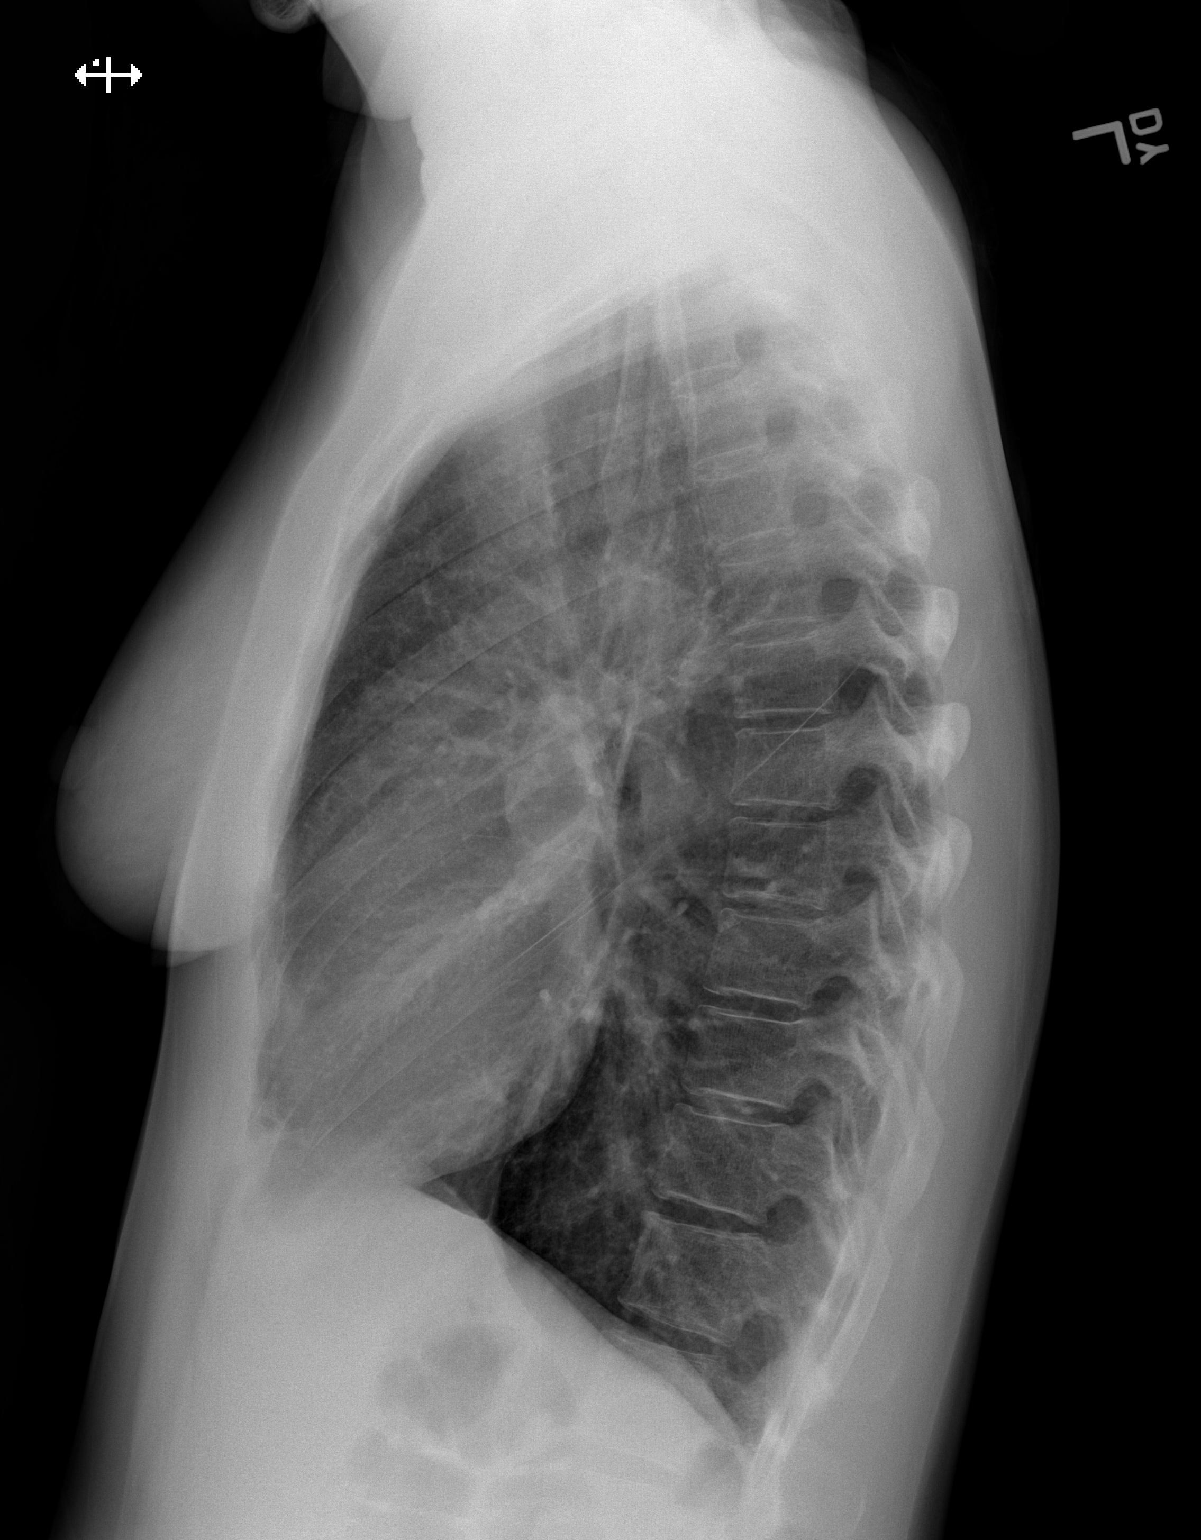

[2 of 2 positions shown; findings below may reference images not displayed]

FINDINGS: Normal heart size, mediastinal contours, and pulmonary vascularity.

Lungs clear.

No pleural effusion or pneumothorax.

Bones unremarkable.
IMPRESSION: Normal exam.

## 2022-01-30 ENCOUNTER — Emergency Department
Admission: EM | Admit: 2022-01-30 | Discharge: 2022-01-30 | Disposition: A | Discharge status: Home / Self Care | Payer: Self-pay | Attending: Emergency Medicine | Admitting: Emergency Medicine

## 2022-01-30 ENCOUNTER — Other Ambulatory Visit: Payer: Self-pay

## 2022-01-30 ENCOUNTER — Emergency Department: Payer: Self-pay

## 2022-01-30 DIAGNOSIS — M25562 Pain in left knee: Secondary | ICD-10-CM | POA: Insufficient documentation

## 2022-01-30 MED ORDER — MELOXICAM 15 MG PO TABS: 15.0000 mg | ORAL_TABLET | Freq: Every day | ORAL | 0 refills | Status: AC

## 2022-01-30 NOTE — ED Provider Triage Note (Signed)
  Emergency Medicine Provider Triage Evaluation Note  Maria Knox , a 52 y.o.female,  was evaluated in triage.  Pt complains of left knee pain.  She states that she hit her knee approximately 3 days ago.  Since then she has had increased ecchymosis and pain along the affected area.  Denies any other injuries at this time.  She is still able to ambulate on her own.   Review of Systems  Positive: Left knee/proximal tibia pain. Negative: Denies fever, chest pain, vomiting  Physical Exam  There were no vitals filed for this visit. Gen:   Awake, no distress   Resp:  Normal effort  MSK:   Moves extremities without difficulty  Other:    Medical Decision Making  Given the patient's initial medical screening exam, the following diagnostic evaluation has been ordered. The patient will be placed in the appropriate treatment space, once one is available, to complete the evaluation and treatment. I have discussed the plan of care with the patient and I have advised the patient that an ED physician or mid-level practitioner will reevaluate their condition after the test results have been received, as the results may give them additional insight into the type of treatment they may need.    Diagnostics: Left knee x-ray.  Treatments: none immediately   Teodoro Spray, [place] 01/30/22 1225

## 2022-01-30 NOTE — ED Provider Notes (Signed)
[facility] Provider Note    None    (approximate)   History   Chief Complaint Knee Pain   HPI Maria Knox is a 52 y.o. female, no significant medical history, presents to the emergency department for evaluation of left knee pain.  She states that she accidentally struck her left knee/proximal tibia on something the other day and has since been walking on it at work, which she states has exacerbated her pain.  She noticed a bruise on her knee.  She is able to ambulate, though with difficulty.  She has been wearing a knee brace for comfort.  Denies thigh pain, hip pain, cold sensation in the affected extremity, numbness/tingling, rash/lesions, headache, abdominal pain, nausea/vomiting, or dizziness/lightheadedness.  History Limitations: No limitations.        Physical Exam  Triage Vital Signs: ED Triage Vitals  Enc Vitals Group     BP 01/30/22 1232 135/87     Pulse Rate 01/30/22 1232 85     Resp 01/30/22 1232 18     Temp 01/30/22 1232 98 F (36.7 C)     Temp src --      SpO2 01/30/22 1232 99 %     Weight --      Height --      Head Circumference --      Peak Flow --      Pain Score 01/30/22 1231 8     Pain Loc --      Pain Edu? --      Excl. in [place]? --     Most recent vital signs: Vitals:   01/30/22 1232  BP: 135/87  Pulse: 85  Resp: 18  Temp: 98 F (36.7 C)  SpO2: 99%    General: Awake, NAD.  Skin: Warm, dry. No rashes or lesions.  Eyes: PERRL. Conjunctivae normal.  CV: Good peripheral perfusion.  Resp: Normal effort.  Abd: Soft, non-tender. No distention.  Neuro: At baseline. No gross neurological deficits.  Musculoskeletal: Normal ROM of all extremities.  Focused Exam: Tenderness appreciated along the proximal tibial region.  Mild ecchymosis present along the affected site.  No joint instability.  Normal pulses distally.  Sensation intact.  She is still able to ambulate on her own without any significant  difficulty.  Physical Exam    ED Results / Procedures / Treatments  Labs (all labs ordered are listed, but only abnormal results are displayed) Labs Reviewed - No data to display   EKG N/A.    RADIOLOGY  ED Provider Interpretation: I personally reviewed and interpreted this x-ray, small ossicle at the superior aspect of the tibial tubercle.  DG Knee Complete 4 Views Left  Result Date: 01/30/2022 CLINICAL DATA:  Left knee pain.  Bumps left knee the other day. EXAM: LEFT KNEE - COMPLETE 4+ VIEW COMPARISON:  None Available. FINDINGS: Normal bone mineralization. Small 6 mm chronic ossicle at the superior aspect of the tibial tubercle, likely the sequela of chronic Osgood-Schlatter disease. No significant overlying soft tissue swelling. No joint effusion. Joint spaces are preserved. No acute fracture or dislocation. IMPRESSION: Small chronic ossicle at the superior aspect of the tibial tubercle, likely the sequela of chronic Osgood-Schlatter disease. Electronically Signed   By: Yvonne Kendall M.D.   On: 01/30/2022 12:59    PROCEDURES:  Critical Care performed: N/A.  Procedures    MEDICATIONS ORDERED IN ED: Medications - No data to display   IMPRESSION / MDM / [place] / ED  COURSE  I reviewed the triage vital signs and the nursing notes.                              Differential diagnosis includes, but is not limited to, tibia fracture, patella fracture, fibula fracture, knee sprain, ACL/PCL injury, MCL/LCL injury.  Assessment/Plan Patient presents with left lower extremity pain after striking her knee/proximal tibia on an object a few days ago.  X-rays does not show any major fractures, but does show a ossicle at the superior aspect of the tibial tubercle, which was interpreted to most likely be sequelae of chronic Osgood-Schlatter disease.  She states that she does not have any prior history of this that she knew of.  Suspect that it may be acute.  However, it  appears mild and she does not have any instability at this time.  Advised her to continue wearing her brace.  Provide her with a prescription for meloxicam to help with pain.  Recommend that she follow-up with her orthopedist for reevaluation.  She was amenable to this plan.  Will discharge.  Provided the patient with anticipatory guidance, return precautions, and educational material. Encouraged the patient to return to the emergency department at any time if they begin to experience any new or worsening symptoms. Patient expressed understanding and agreed with the plan.   Patient's presentation is most consistent with acute complicated illness / injury requiring diagnostic workup.       FINAL CLINICAL IMPRESSION(S) / ED DIAGNOSES   Final diagnoses:  Acute pain of left knee     Rx / DC Orders   ED Discharge Orders          Ordered    meloxicam (MOBIC) 15 MG tablet  Daily        01/30/22 1312             Note:  This document was prepared using Dragon voice recognition software and may include unintentional dictation errors.   Teodoro Spray, [place] 01/30/22 1537    Blake Divine, MD 01/31/22 [phone]

## 2022-01-30 NOTE — ED Triage Notes (Signed)
Pt comes with c/o left knee pain. Pt states she bumped it the other day and then worked on it making it worse.

## 2022-01-30 NOTE — Discharge Instructions (Addendum)
-  Your x-ray does not show any signs of major fractures, though does show a small injury along the top of your tibia which may be acute or may be chronic from previous Osgood slaughters disease.  -You may continue wearing your brace as needed for comfort.  You may additionally take the meloxicam as needed for pain.  You may take acetaminophen as well.  -Please follow-up with the orthopedist listed in these instructions for further evaluation if your symptoms persist beyond a couple weeks.  -Return to the emergency department at any time if you begin to experience any new or worsening symptoms.
